# Patient Record
Sex: Female | Born: 1990 | Race: White | Hispanic: No | Marital: Single | Smoking: Never smoker
Health system: Southern US, Community
[De-identification: ages and names within clinical notes are randomized; demographics above are authoritative.]

---

## 2015-03-20 ENCOUNTER — Emergency Department (INDEPENDENT_AMBULATORY_CARE_PROVIDER_SITE_OTHER)
Admission: EM | Admit: 2015-03-20 | Discharge: 2015-03-20 | Disposition: A | Discharge status: Home / Self Care | Payer: Managed Care, Other (non HMO) | Source: Home / Self Care

## 2015-03-20 ENCOUNTER — Encounter (HOSPITAL_COMMUNITY): Payer: Self-pay | Admitting: Emergency Medicine

## 2015-03-20 ENCOUNTER — Emergency Department (INDEPENDENT_AMBULATORY_CARE_PROVIDER_SITE_OTHER): Payer: Managed Care, Other (non HMO)

## 2015-03-20 DIAGNOSIS — S93402A Sprain of unspecified ligament of left ankle, initial encounter: Secondary | ICD-10-CM

## 2015-03-20 MED ORDER — NAPROXEN 500 MG PO TABS: 500.0000 mg | ORAL_TABLET | Freq: Two times a day (BID) | ORAL | Status: AC

## 2015-03-20 NOTE — ED Provider Notes (Signed)
CSN: [id number]     Arrival date & time 03/20/15  1836 History   None    Chief Complaint  Patient presents with  . Foot Injury   (Consider location/radiation/quality/duration/timing/severity/associated sxs/prior Treatment) Patient is a 24 y.o. female presenting with foot injury. The history is provided by the patient.  Foot Injury Location:  Foot Time since incident:  1 day Foot location:  L foot Pain details:    Quality:  Aching   Radiates to:  Does not radiate   Severity:  Moderate   Timing:  Constant   Progression:  Worsening Chronicity:  New Dislocation: no   Foreign body present:  No foreign bodies Prior injury to area:  Yes Relieved by:  Movement Worsened by:  Nothing tried   No past medical history on file. No past surgical history on file. No family history on file. Social History  Substance Use Topics  . Smoking status: Not on file  . Smokeless tobacco: Not on file  . Alcohol Use: Not on file   OB History    No data available     Review of Systems  Constitutional: Negative.   HENT: Negative.   Eyes: Negative.   Respiratory: Negative.   Cardiovascular: Negative.   Gastrointestinal: Negative.   Musculoskeletal: Positive for joint swelling and arthralgias.    Allergies  Review of patient's allergies indicates not on file.  Home Medications   Prior to Admission medications   Not on File   Meds Ordered and Administered this Visit  Medications - No data to display  There were no vitals taken for this visit. No data found.   Physical Exam  Constitutional: She appears well-developed and well-nourished.  HENT:  Head: Normocephalic.  Eyes: Conjunctivae and EOM are normal. Pupils are equal, round, and reactive to light.  Neck: Normal range of motion. Neck supple.  Cardiovascular: Normal rate and regular rhythm.   Pulmonary/Chest: Effort normal and breath sounds normal.  Musculoskeletal: She exhibits edema and tenderness.  Left lateral ankle and  anterior foot with swelling and tenderness.    ED Course  Procedures (including critical care time)  Labs Review Labs Reviewed - No data to display  Imaging Review No results found.          MDM  Left ankle sprain Naprosyn 500 mg po bid x 10 days #20 ASO wrap to left ankle Follow up Prn   Anselm Pancoast [place] FNP   Deatra Canter, FNP 03/20/15 2023

## 2015-03-20 NOTE — ED Notes (Signed)
Pt reports she fell yest while painting and twisted her foot Sx include swelling at top of foot and pain Also reports numbness of left foot.  Alert.. No signs of acute distress.

## 2015-03-20 NOTE — Discharge Instructions (Signed)

## 2016-04-17 IMAGING — DX DG FOOT COMPLETE 3+V*L*
3 series · 3 of 3 positions shown · non-contrast
Comparison: None.

CLINICAL DATA: Twisting injury yesterday. Swelling at the base of
the metatarsals. Initial encounter.

EXAM:
LEFT FOOT - COMPLETE 3+ VIEW

[foot ap]
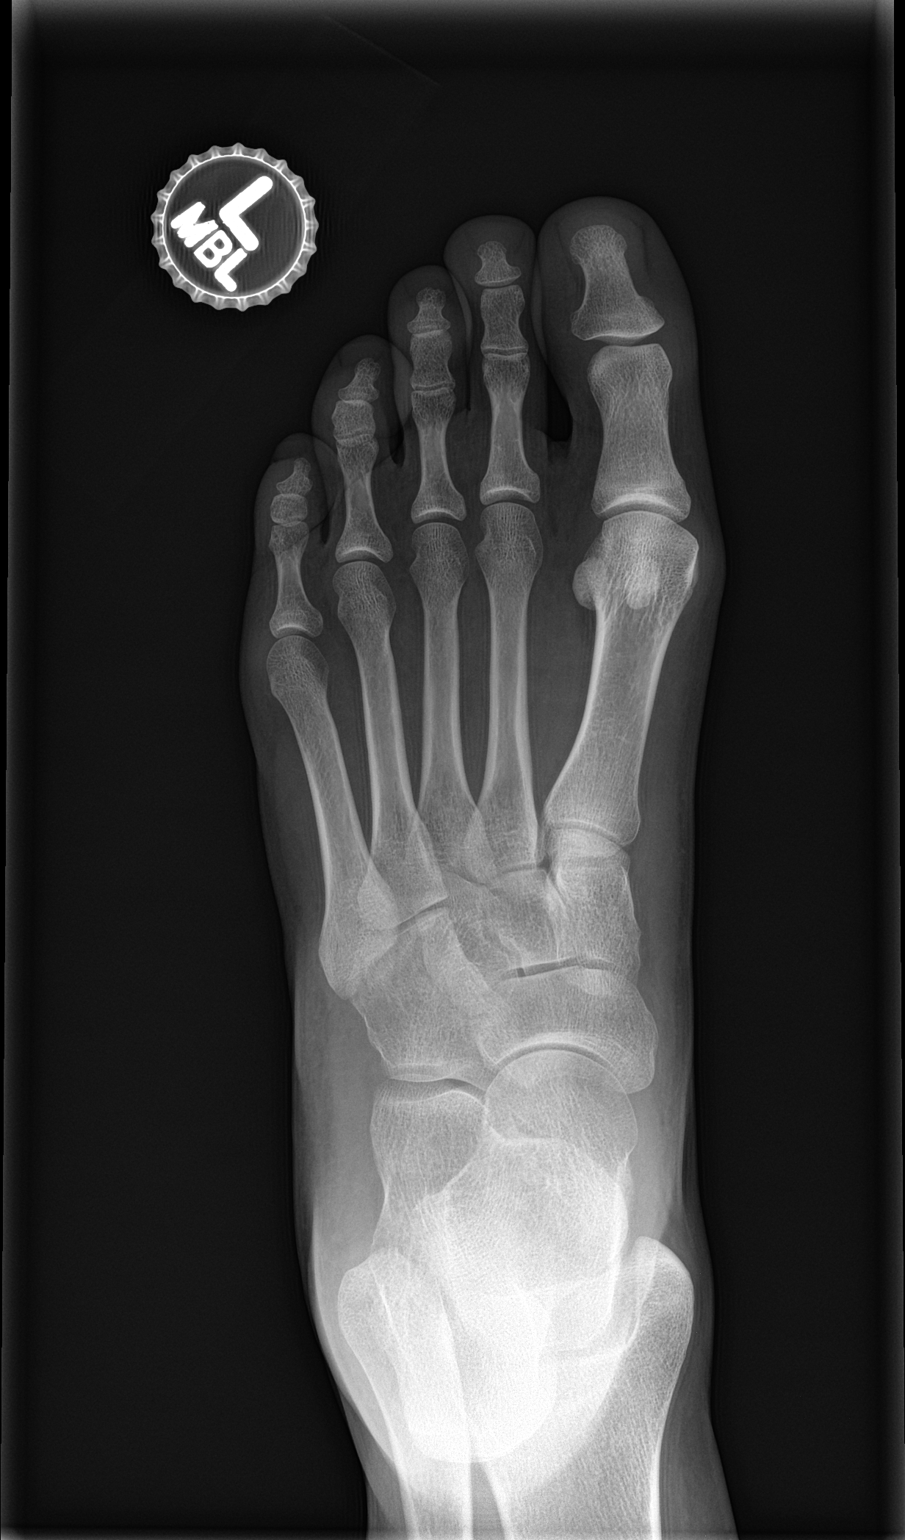

[foot obl]
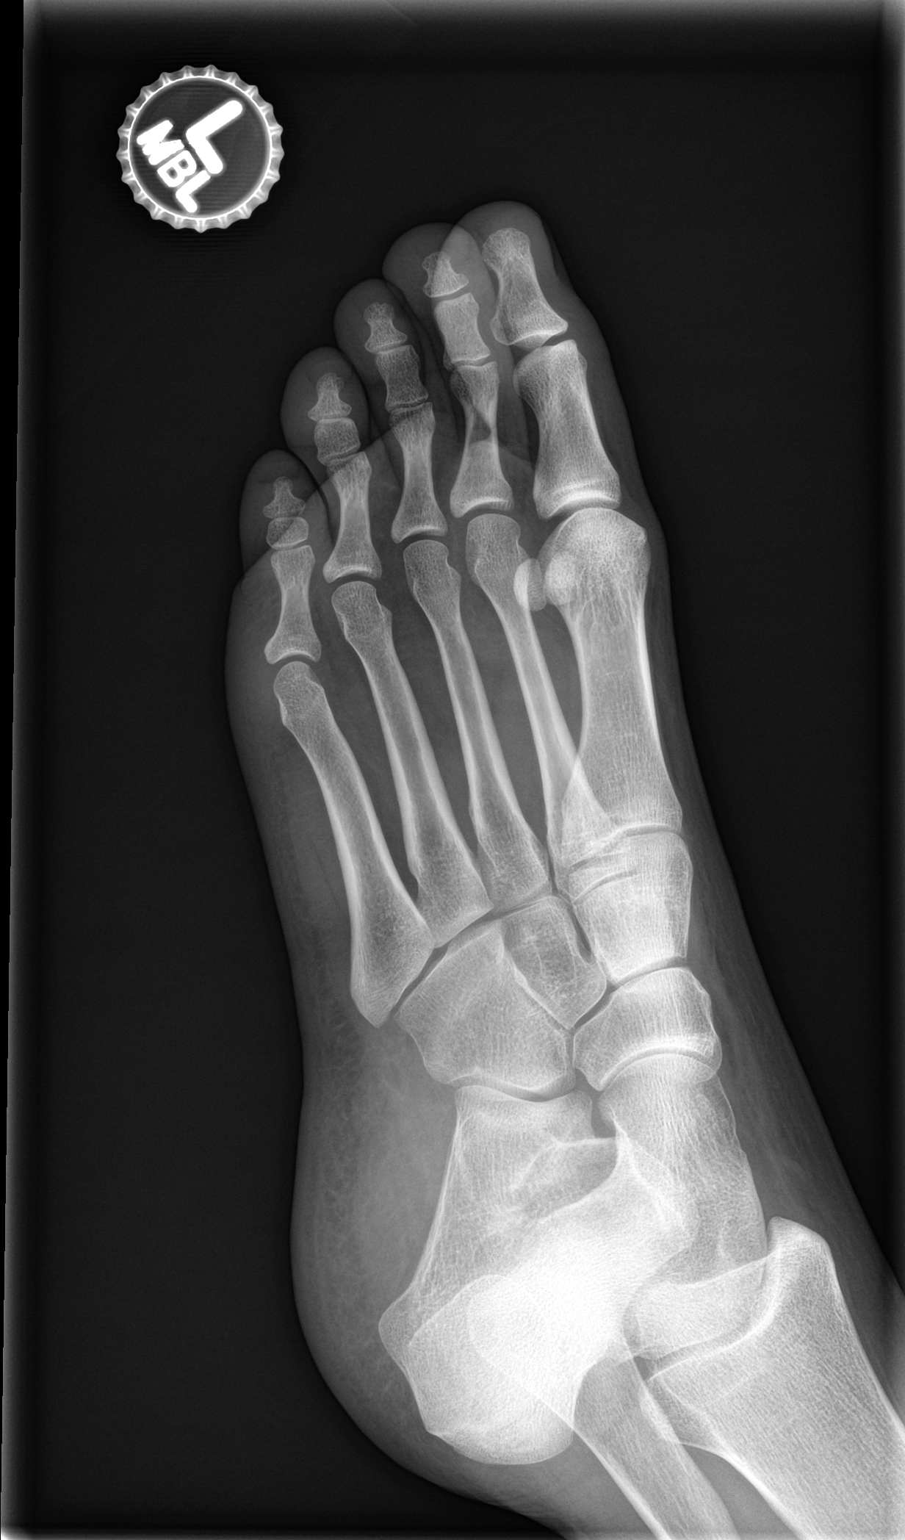

[foot lat]
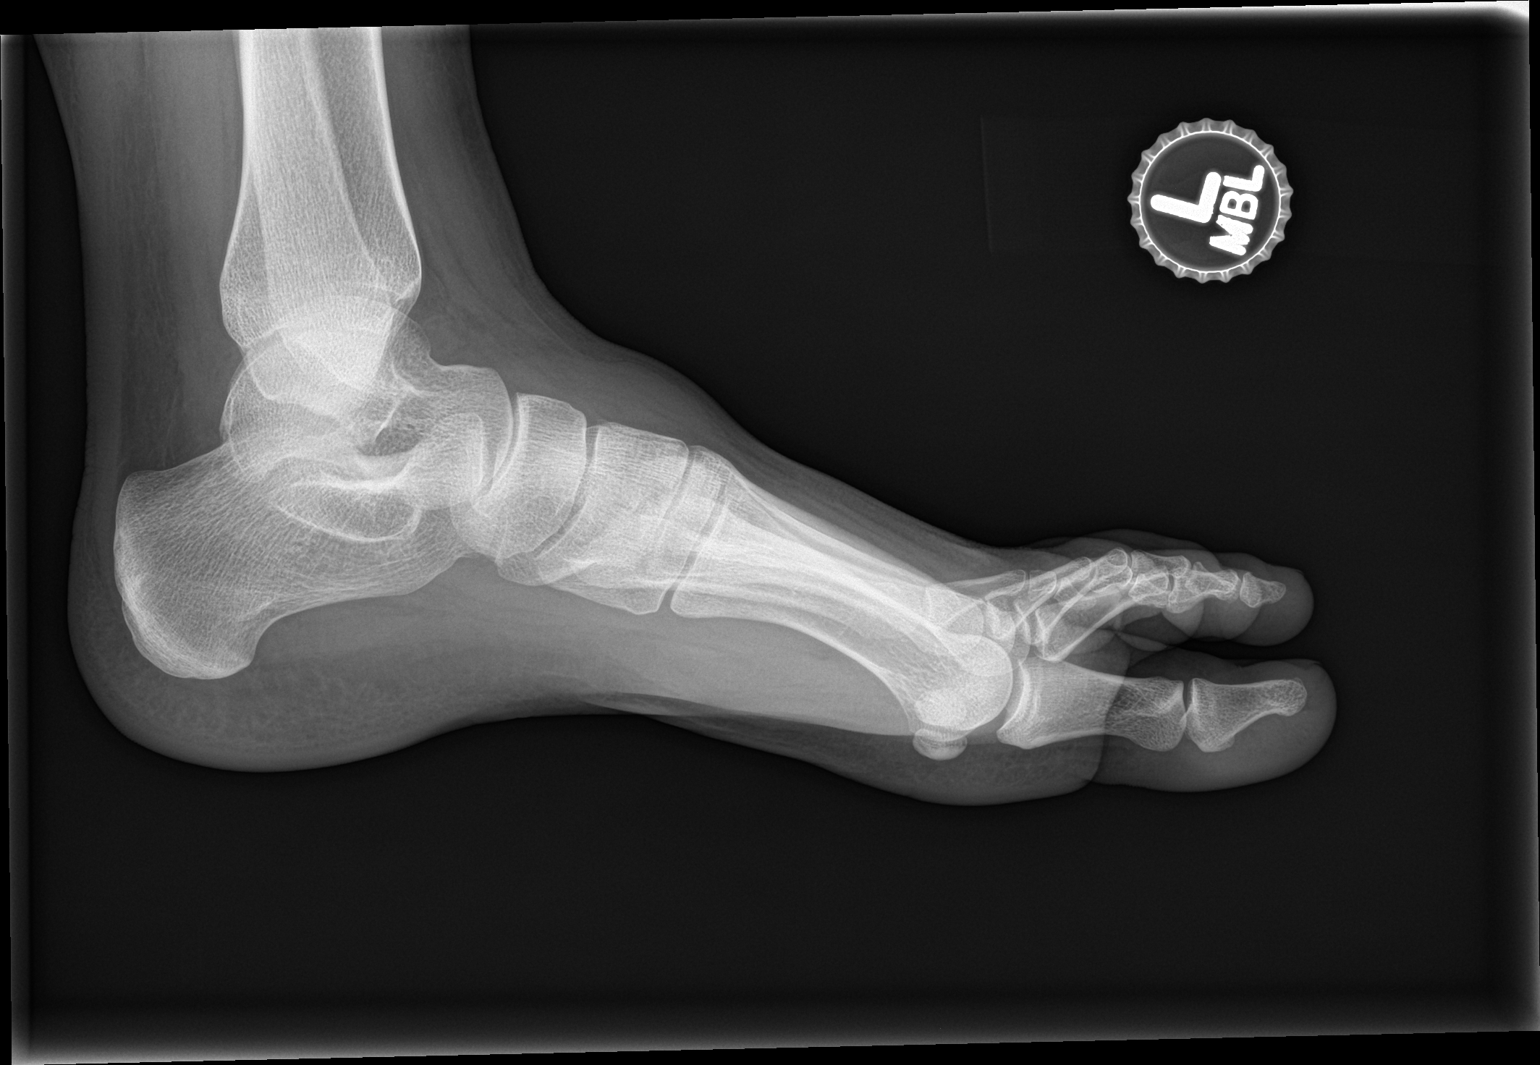

[3 of 3 positions shown; findings below may reference images not displayed]

FINDINGS: The mineralization and alignment are normal. There is no evidence of
acute fracture or dislocation. The joint spaces are maintained. The
alignment is normal at the Lisfranc joint. There is dorsal soft
tissue swelling over the midfoot on the lateral view. No foreign
bodies are observed.
IMPRESSION: Dorsal midfoot soft tissue swelling. No acute osseous findings
demonstrated.
